# Patient Record
Sex: Male | Born: 1955 | Race: White | Hispanic: No | Marital: Married | Smoking: Never smoker
Health system: Southern US, Community
[De-identification: ages and names within clinical notes are randomized; demographics above are authoritative.]

## PROBLEM LIST (undated history)

## (undated) DIAGNOSIS — E038 Other specified hypothyroidism: Secondary | ICD-10-CM

## (undated) DIAGNOSIS — Z7901 Long term (current) use of anticoagulants: Secondary | ICD-10-CM

## (undated) DIAGNOSIS — G4731 Primary central sleep apnea: Secondary | ICD-10-CM

## (undated) DIAGNOSIS — Z86718 Personal history of other venous thrombosis and embolism: Secondary | ICD-10-CM

## (undated) DIAGNOSIS — E291 Testicular hypofunction: Secondary | ICD-10-CM

## (undated) DIAGNOSIS — E230 Hypopituitarism: Secondary | ICD-10-CM

## (undated) DIAGNOSIS — E7800 Pure hypercholesterolemia, unspecified: Secondary | ICD-10-CM

## (undated) DIAGNOSIS — E232 Diabetes insipidus: Secondary | ICD-10-CM

## (undated) DIAGNOSIS — E119 Type 2 diabetes mellitus without complications: Secondary | ICD-10-CM

## (undated) DIAGNOSIS — D352 Benign neoplasm of pituitary gland: Secondary | ICD-10-CM

## (undated) HISTORY — PX: TONSILLECTOMY: SUR1361

## (undated) HISTORY — PX: OTHER SURGICAL HISTORY: SHX169

## (undated) HISTORY — PX: CHOLECYSTECTOMY: SHX55

---

## 2001-06-18 HISTORY — PX: OTHER SURGICAL HISTORY: SHX169

## 2004-03-18 ENCOUNTER — Ambulatory Visit: Payer: Self-pay | Admitting: Internal Medicine

## 2004-04-12 ENCOUNTER — Emergency Department: Payer: Self-pay | Admitting: Emergency Medicine

## 2004-04-26 ENCOUNTER — Ambulatory Visit: Payer: Self-pay

## 2004-04-28 ENCOUNTER — Ambulatory Visit: Payer: Self-pay

## 2004-05-04 ENCOUNTER — Ambulatory Visit: Payer: Self-pay

## 2007-11-18 ENCOUNTER — Ambulatory Visit: Payer: Self-pay | Admitting: Radiology

## 2008-02-05 ENCOUNTER — Ambulatory Visit: Payer: Self-pay | Admitting: Internal Medicine

## 2011-05-22 ENCOUNTER — Ambulatory Visit: Payer: Self-pay | Admitting: Unknown Physician Specialty

## 2013-01-01 ENCOUNTER — Ambulatory Visit: Payer: Self-pay | Admitting: Internal Medicine

## 2013-06-18 DIAGNOSIS — I480 Paroxysmal atrial fibrillation: Secondary | ICD-10-CM

## 2013-06-18 HISTORY — DX: Paroxysmal atrial fibrillation: I48.0

## 2013-10-13 ENCOUNTER — Ambulatory Visit: Payer: Self-pay | Admitting: Cardiology

## 2013-11-12 ENCOUNTER — Ambulatory Visit: Payer: Self-pay | Admitting: Ophthalmology

## 2014-06-18 DIAGNOSIS — R3129 Other microscopic hematuria: Secondary | ICD-10-CM

## 2014-06-18 HISTORY — DX: Other microscopic hematuria: R31.29

## 2014-07-26 ENCOUNTER — Ambulatory Visit: Payer: Self-pay | Admitting: Internal Medicine

## 2014-09-29 ENCOUNTER — Ambulatory Visit
Admit: 2014-09-29 | Disposition: A | Discharge status: Home / Self Care | Payer: Self-pay | Attending: Internal Medicine | Admitting: Internal Medicine

## 2015-06-19 DIAGNOSIS — I724 Aneurysm of artery of lower extremity: Secondary | ICD-10-CM

## 2015-06-19 DIAGNOSIS — Z9889 Other specified postprocedural states: Secondary | ICD-10-CM

## 2015-06-19 DIAGNOSIS — H5347 Heteronymous bilateral field defects: Secondary | ICD-10-CM

## 2015-06-19 HISTORY — DX: Heteronymous bilateral field defects: H53.47

## 2015-06-19 HISTORY — DX: Other specified postprocedural states: Z98.890

## 2015-06-19 HISTORY — DX: Aneurysm of artery of lower extremity: I72.4

## 2015-11-04 IMAGING — CT CT ABD-PELV W/O CM
2 of 4 series · 17 of 46 positions shown, 19 images · non-contrast
Comparison: None.

CLINICAL DATA: Recurrent microscopic hematuria, asymptomatic. No
history of kidney stones. Status post cholecystectomy. History of
pituitary tumor status post resection.

EXAM:
CT ABDOMEN AND PELVIS WITHOUT CONTRAST
TECHNIQUE: Multidetector CT imaging of the abdomen and pelvis was performed
following the standard protocol without IV contrast.

[Series 2: stone · axial · 0.92mm/px · z∈[-919,-479]mm · 14 of 96 slices shown, 16 images]
[im 4/96  soft-tissue]
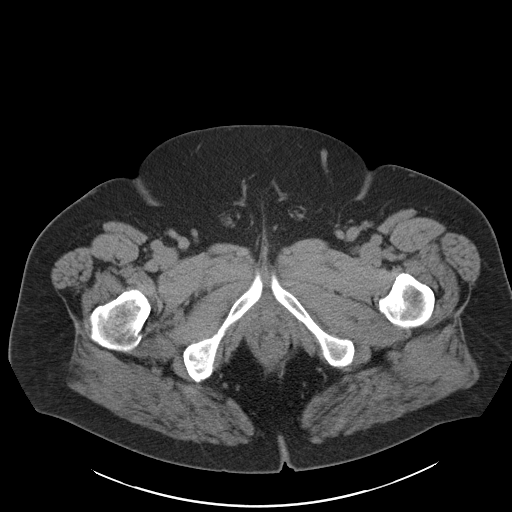
[im 4/96  bone]
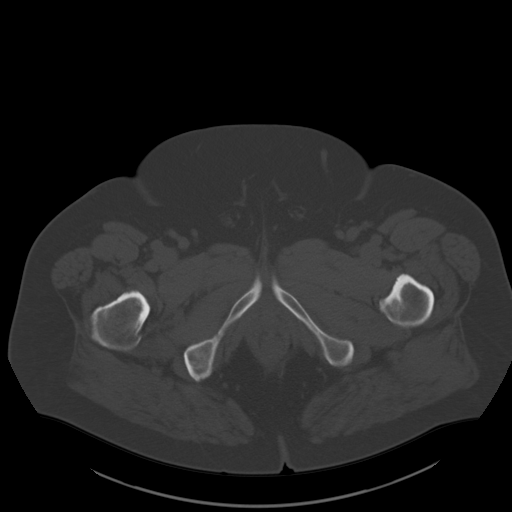
[im 12/96  soft-tissue]
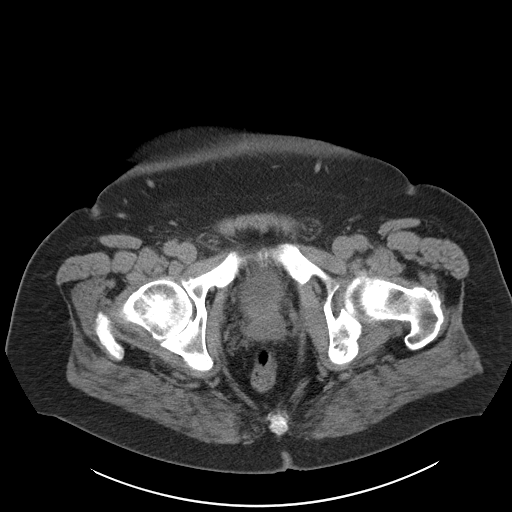
[im 20/96  soft-tissue]
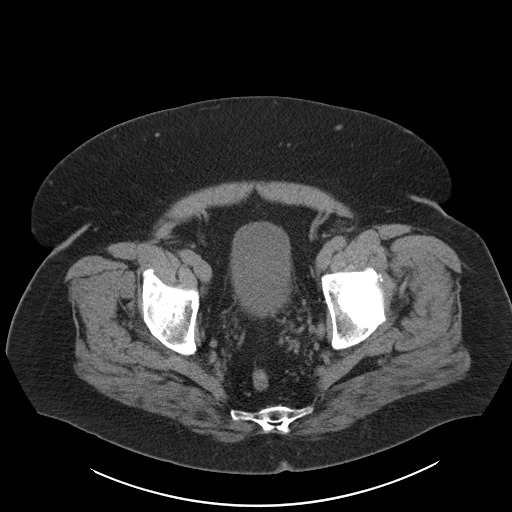
[im 27/96  soft-tissue]
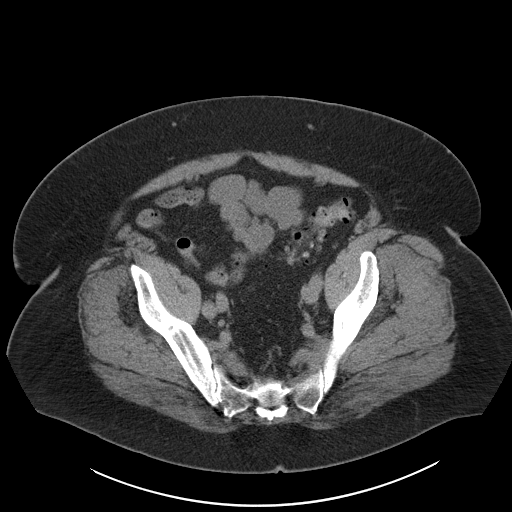
[im 31/96  soft-tissue]
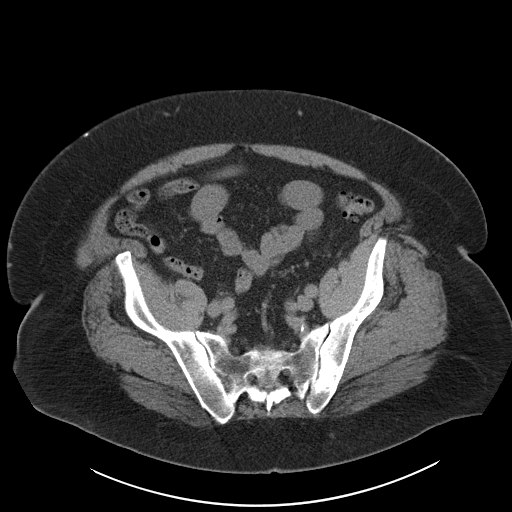
[im 39/96  soft-tissue]
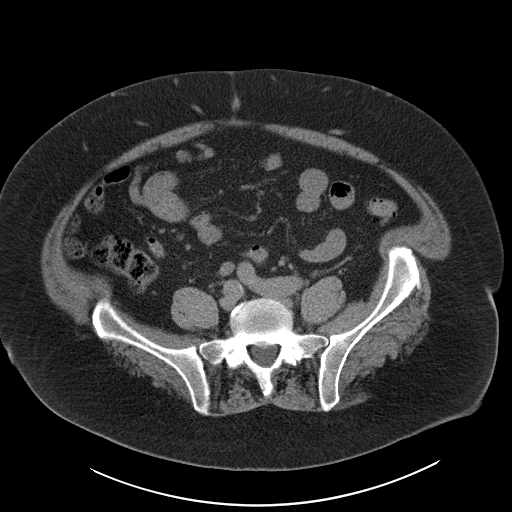
[im 46/96  soft-tissue]
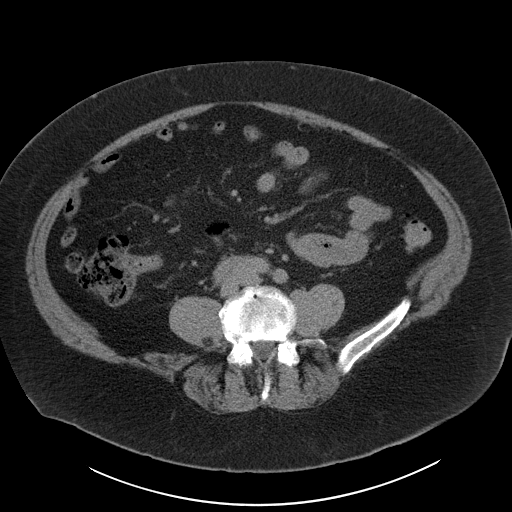
[im 50/96  soft-tissue]
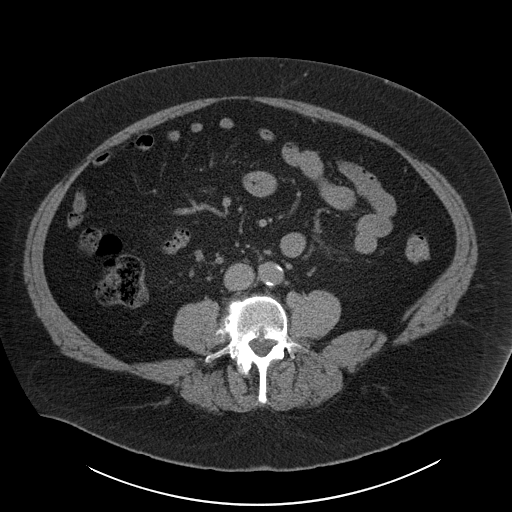
[im 58/96  soft-tissue]
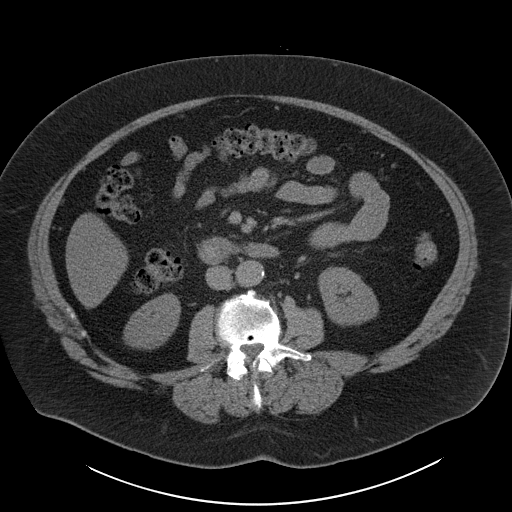
[im 58/96  bone]
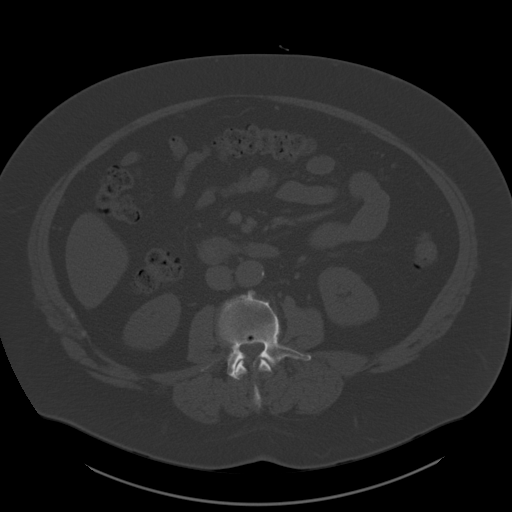
[im 65/96  soft-tissue]
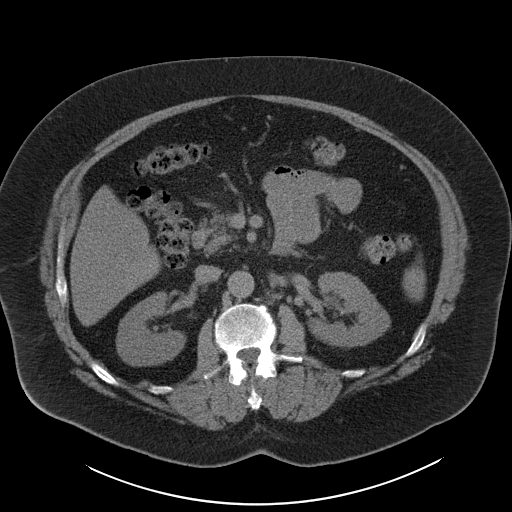
[im 73/96  soft-tissue]
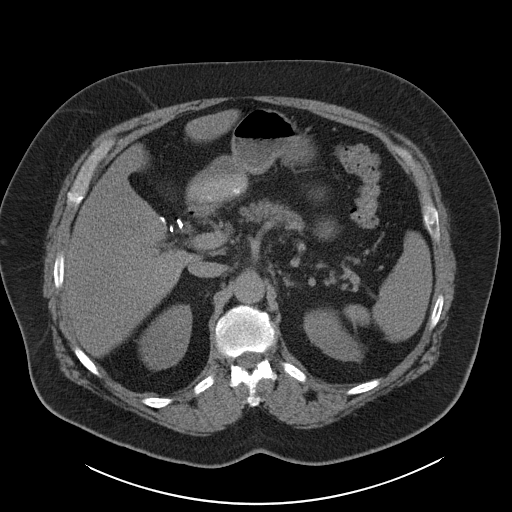
[im 77/96  soft-tissue]
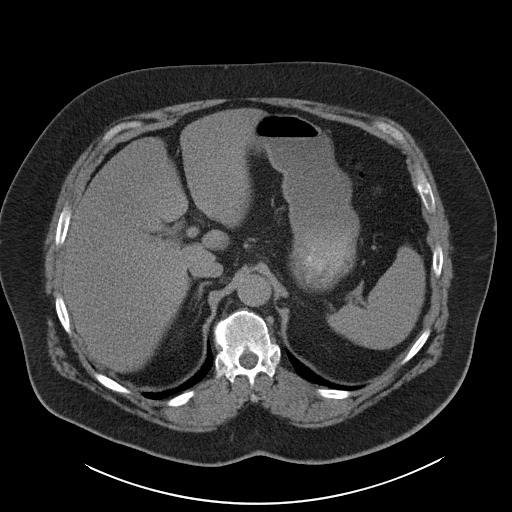
[im 84/96  soft-tissue]
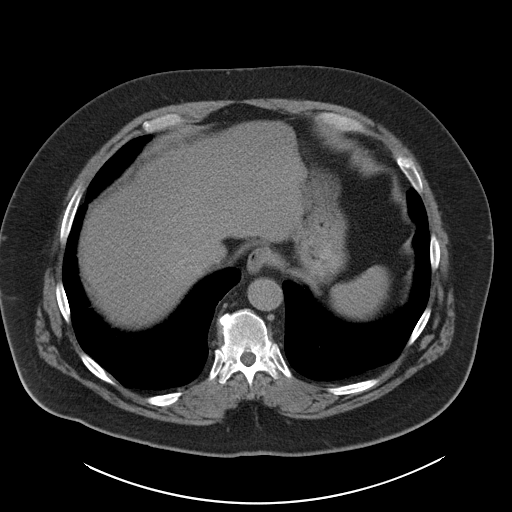
[im 92/96  soft-tissue]
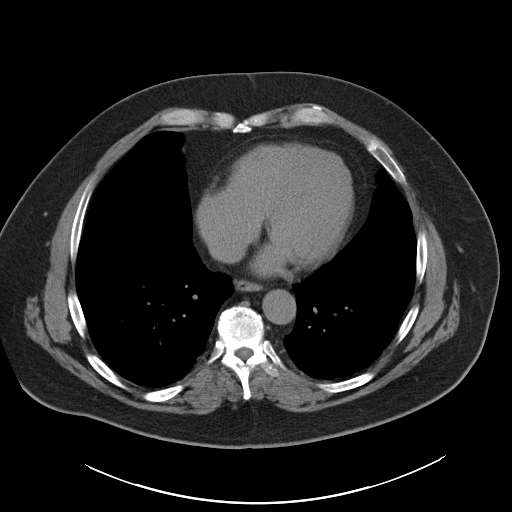

[Series 5: cor · coronal · 0.96mm/px · 3 of 186 slices shown]
[im 62/186  soft-tissue]
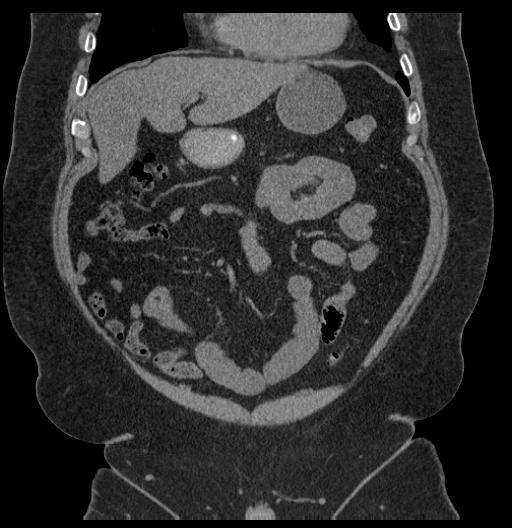
[im 83/186  soft-tissue]
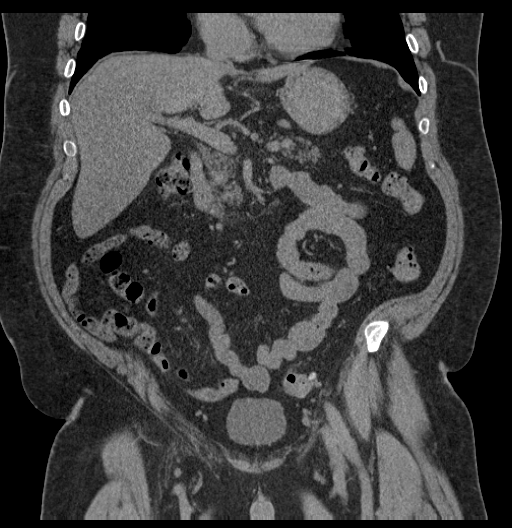
[im 103/186  soft-tissue]
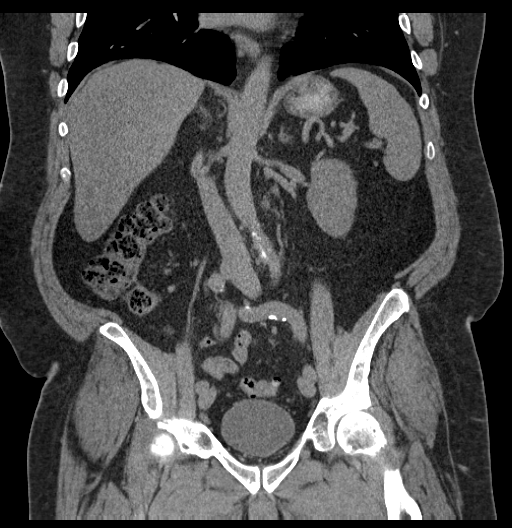

[17 of 46 positions shown; findings below may reference images not displayed]

FINDINGS: Lower chest:  Lung bases are clear.

Hepatobiliary: Mild to moderate hepatic steatosis.

Status post cholecystectomy. No intrahepatic or extrahepatic ductal
dilatation.

Pancreas: Within normal limits.

Spleen: Within normal limits.

Adrenals/Urinary Tract: Adrenal glands are within normal limits.

Kidneys are within normal limits. No renal calculi or
hydronephrosis.

No ureteral or bladder calculi.

Bladder is within normal limits.

Stomach/Bowel: Stomach is within normal limits.

No evidence of bowel obstruction.

Normal appendix.

Vascular/Lymphatic: Atherosclerotic calcifications of the abdominal
aorta and branch vessels.

No suspicious abdominopelvic lymphadenopathy.

Reproductive: Prostate is grossly unremarkable by CT.

Other: No abdominopelvic ascites.

Musculoskeletal: Degenerative changes of the visualized
thoracolumbar spine, most prominent at L2-3 and L3-4.
IMPRESSION: No renal, ureteral, or bladder calculi.  No hydronephrosis.

Mild to moderate hepatic steatosis.

Status post cholecystectomy.

## 2016-05-31 ENCOUNTER — Other Ambulatory Visit: Payer: Self-pay | Admitting: Internal Medicine

## 2016-05-31 DIAGNOSIS — D352 Benign neoplasm of pituitary gland: Secondary | ICD-10-CM

## 2022-12-05 ENCOUNTER — Encounter: Payer: Self-pay | Admitting: Internal Medicine

## 2022-12-11 ENCOUNTER — Encounter: Payer: Self-pay | Admitting: Internal Medicine

## 2022-12-12 ENCOUNTER — Ambulatory Visit
Admission: RE | Admit: 2022-12-12 | Discharge: 2022-12-12 | Disposition: A | Discharge status: Home / Self Care | Payer: 59 | Attending: Internal Medicine | Admitting: Internal Medicine

## 2022-12-12 ENCOUNTER — Encounter
Admission: RE | Disposition: A | Discharge status: Home / Self Care | Payer: Self-pay | Source: Home / Self Care | Attending: Internal Medicine

## 2022-12-12 ENCOUNTER — Ambulatory Visit: Payer: 59 | Admitting: Anesthesiology

## 2022-12-12 DIAGNOSIS — Z1211 Encounter for screening for malignant neoplasm of colon: Secondary | ICD-10-CM | POA: Diagnosis present

## 2022-12-12 DIAGNOSIS — E039 Hypothyroidism, unspecified: Secondary | ICD-10-CM | POA: Diagnosis not present

## 2022-12-12 DIAGNOSIS — Z9852 Vasectomy status: Secondary | ICD-10-CM | POA: Insufficient documentation

## 2022-12-12 DIAGNOSIS — I480 Paroxysmal atrial fibrillation: Secondary | ICD-10-CM | POA: Insufficient documentation

## 2022-12-12 DIAGNOSIS — E232 Diabetes insipidus: Secondary | ICD-10-CM | POA: Diagnosis not present

## 2022-12-12 DIAGNOSIS — E119 Type 2 diabetes mellitus without complications: Secondary | ICD-10-CM | POA: Diagnosis not present

## 2022-12-12 DIAGNOSIS — Z7901 Long term (current) use of anticoagulants: Secondary | ICD-10-CM | POA: Diagnosis not present

## 2022-12-12 DIAGNOSIS — K640 First degree hemorrhoids: Secondary | ICD-10-CM | POA: Insufficient documentation

## 2022-12-12 DIAGNOSIS — Z86018 Personal history of other benign neoplasm: Secondary | ICD-10-CM | POA: Diagnosis not present

## 2022-12-12 DIAGNOSIS — E7800 Pure hypercholesterolemia, unspecified: Secondary | ICD-10-CM | POA: Diagnosis not present

## 2022-12-12 DIAGNOSIS — Z86718 Personal history of other venous thrombosis and embolism: Secondary | ICD-10-CM | POA: Diagnosis not present

## 2022-12-12 DIAGNOSIS — Z9889 Other specified postprocedural states: Secondary | ICD-10-CM | POA: Diagnosis not present

## 2022-12-12 DIAGNOSIS — K5730 Diverticulosis of large intestine without perforation or abscess without bleeding: Secondary | ICD-10-CM | POA: Insufficient documentation

## 2022-12-12 DIAGNOSIS — G4731 Primary central sleep apnea: Secondary | ICD-10-CM | POA: Diagnosis not present

## 2022-12-12 DIAGNOSIS — Z9049 Acquired absence of other specified parts of digestive tract: Secondary | ICD-10-CM | POA: Insufficient documentation

## 2022-12-12 DIAGNOSIS — Z09 Encounter for follow-up examination after completed treatment for conditions other than malignant neoplasm: Secondary | ICD-10-CM | POA: Insufficient documentation

## 2022-12-12 DIAGNOSIS — D125 Benign neoplasm of sigmoid colon: Secondary | ICD-10-CM | POA: Diagnosis not present

## 2022-12-12 DIAGNOSIS — Z7985 Long-term (current) use of injectable non-insulin antidiabetic drugs: Secondary | ICD-10-CM | POA: Insufficient documentation

## 2022-12-12 HISTORY — DX: Testicular hypofunction: E29.1

## 2022-12-12 HISTORY — DX: Long term (current) use of anticoagulants: Z79.01

## 2022-12-12 HISTORY — DX: Dependence on other enabling machines and devices: G47.31

## 2022-12-12 HISTORY — PX: POLYPECTOMY: SHX5525

## 2022-12-12 HISTORY — DX: Benign neoplasm of pituitary gland: D35.2

## 2022-12-12 HISTORY — DX: Hypopituitarism: E23.0

## 2022-12-12 HISTORY — DX: Personal history of other venous thrombosis and embolism: Z86.718

## 2022-12-12 HISTORY — DX: Other specified hypothyroidism: E03.8

## 2022-12-12 HISTORY — PX: COLONOSCOPY: SHX5424

## 2022-12-12 HISTORY — DX: Type 2 diabetes mellitus without complications: E11.9

## 2022-12-12 HISTORY — DX: Diabetes insipidus: E23.2

## 2022-12-12 HISTORY — DX: Pure hypercholesterolemia, unspecified: E78.00

## 2022-12-12 LAB — GLUCOSE, CAPILLARY: Glucose-Capillary: 144 mg/dL — ABNORMAL HIGH (ref 70–99)

## 2022-12-12 SURGERY — COLONOSCOPY
Anesthesia: General

## 2022-12-12 MED ORDER — SODIUM CHLORIDE 0.9 % IV SOLN
INTRAVENOUS | Status: DC
  Administered 2022-12-12: 20 mL/h via INTRAVENOUS

## 2022-12-12 MED ORDER — PROPOFOL 500 MG/50ML IV EMUL
INTRAVENOUS | Status: DC | PRN
  Administered 2022-12-12: 120 ug/kg/min via INTRAVENOUS

## 2022-12-12 MED ORDER — LIDOCAINE HCL (CARDIAC) PF 100 MG/5ML IV SOSY
PREFILLED_SYRINGE | INTRAVENOUS | Status: DC | PRN
  Administered 2022-12-12: 70 mg via INTRAVENOUS
  Administered 2022-12-12: 30 mg via INTRAVENOUS

## 2022-12-12 MED ORDER — PROPOFOL 10 MG/ML IV BOLUS
INTRAVENOUS | Status: DC | PRN
  Administered 2022-12-12: 20 mg via INTRAVENOUS
  Administered 2022-12-12: 70 mg via INTRAVENOUS
  Administered 2022-12-12: 20 mg via INTRAVENOUS

## 2022-12-12 NOTE — Op Note (Signed)
[facility] Gastroenterology Patient Name: Alexander Cline Procedure Date: 12/12/2022 11:28 AM MRN: [id number] Account #: [id number] Date of Birth: Oct 29, 1955 Admit Type: Outpatient Age: 67 Room: [facility] ENDO ROOM 4 Gender: Male Note Status: Finalized Instrument Name: Colonoscope [id number] Procedure:             Colonoscopy Indications:           Screening for colorectal malignant neoplasm Providers:             Royce Macadamia K. Norma Fredrickson MD, MD Referring MD:          Daniel Nones, MD (Referring MD) Medicines:             Propofol per Anesthesia Complications:         No immediate complications. Procedure:             Pre-Anesthesia Assessment:                        - Prior to the procedure, a History and Physical was                         performed, and patient medications, allergies and                         sensitivities were reviewed. The patient's tolerance                         of previous anesthesia was reviewed.                        - The risks and benefits of the procedure and the                         sedation options and risks were discussed with the                         patient. All questions were answered and informed                         consent was obtained.                        - Patient identification and proposed procedure were                         verified prior to the procedure. The procedure was                         verified in the procedure room.                        - ASA Grade Assessment: III - A patient with severe                         systemic disease.                        - After reviewing the risks and benefits, the patient  was deemed in satisfactory condition to undergo the                         procedure.                        After obtaining informed consent, the colonoscope was                         passed under direct vision. Throughout the procedure,                         the  patient's blood pressure, pulse, and oxygen                         saturations were monitored continuously. The                         Colonoscope was introduced through the anus and                         advanced to the the cecum, identified by appendiceal                         orifice and ileocecal valve. The colonoscopy was                         performed without difficulty. The patient tolerated                         the procedure well. The quality of the bowel                         preparation was excellent. The ileocecal valve,                         appendiceal orifice, and rectum were photographed. Findings:      The perianal and digital rectal examinations were normal. Pertinent       negatives include normal sphincter tone and no palpable rectal lesions.      Non-bleeding internal hemorrhoids were found during retroflexion. The       hemorrhoids were Grade I (internal hemorrhoids that do not prolapse).      Many small-mouthed diverticula were found in the sigmoid colon. There       was no evidence of diverticular bleeding.      A 5 mm polyp was found in the sigmoid colon. The polyp was sessile. The       polyp was removed with a jumbo cold forceps. Resection and retrieval       were complete.      The exam was otherwise without abnormality. Impression:            - Non-bleeding internal hemorrhoids.                        - Mild diverticulosis in the sigmoid colon. There was                         no evidence of diverticular bleeding.                        -  One 5 mm polyp in the sigmoid colon, removed with a                         jumbo cold forceps. Resected and retrieved.                        - The examination was otherwise normal. Recommendation:        - Patient has a contact number available for                         emergencies. The signs and symptoms of potential                         delayed complications were discussed with the patient.                          Return to normal activities tomorrow. Written                         discharge instructions were provided to the patient.                        - Resume previous diet.                        - Continue present medications.                        - Repeat colonoscopy is recommended for surveillance.                         The colonoscopy date will be determined after                         pathology results from today's exam become available                         for review.                        - Return to GI office PRN.                        - The findings and recommendations were discussed with                         the patient. Procedure Code(s):     --- Professional ---                        [phone], Colonoscopy, flexible; with biopsy, single or                         multiple Diagnosis Code(s):     --- Professional ---                        K57.30, Diverticulosis of large intestine without                         perforation or abscess without bleeding  D12.5, Benign neoplasm of sigmoid colon                        K64.0, First degree hemorrhoids                        Z12.11, Encounter for screening for malignant neoplasm                         of colon CPT copyright 2022 [employer]. All rights reserved. The codes documented in this report are preliminary and upon coder review may  be revised to meet current compliance requirements. Stanton Kidney MD, MD 12/12/2022 11:55:29 AM This report has been signed electronically. Number of Addenda: 0 Note Initiated On: 12/12/2022 11:28 AM Scope Withdrawal Time: 0 hours 5 minutes 19 seconds  Total Procedure Duration: 0 hours 7 minutes 42 seconds  Estimated Blood Loss:  Estimated blood loss: none.      [facility]

## 2022-12-12 NOTE — H&P (Signed)
Outpatient short stay form Pre-procedure 12/12/2022 11:25 AM Dara Beidleman K. Norma Fredrickson, M.D.  Primary Physician: Daniel Nones III, M.D.  Reason for visit:  Colon cancer screening  History of present illness:  Mr. Kirk reports doing well from a GI standpoint. He denies any issues with constipation, diarrhea, melena or rectal bleeding. Moving his bowels daily. Feels bowels slowed down slightly when starting Trulicity. No lower abd pain.   He denies any GERD, nausea, vomiting, epigastric pain, or dysphagia. He denies alcohol tobacco or NSAIDs. Uses Tylenol for pain.  Denies any family history of colon polyps or colon cance     Current Facility-Administered Medications:    0.9 %  sodium chloride infusion, , Intravenous, Continuous, Jakarius Flamenco, Boykin Nearing, MD, Last Rate: 20 mL/hr at 12/12/22 1013, 20 mL/hr at 12/12/22 1013  Medications Prior to Admission  Medication Sig Dispense Refill Last Dose   co-enzyme Q-10 30 MG capsule Take 30 mg by mouth 3 (three) times daily.   Past Week   Coenzyme Q10-Vitamin E 100-150 MG-UNIT CAPS Take 1 capsule by mouth daily.   Past Week   desmopressin (DDAVP NASAL) 0.01 % solution Place 10 mcg into the nose 2 (two) times daily.   12/11/2022   diltiazem (CARDIZEM CD) 240 MG 24 hr capsule Take 120 mg by mouth daily.   12/12/2022 at 0600   Dulaglutide 0.75 MG/0.5ML SOPN Inject 0.5 mLs into the skin 2 (two) times daily.   Past Month   flecainide (TAMBOCOR) 100 MG tablet Take 100 mg by mouth 2 (two) times daily.   12/12/2022 at 0600   levothyroxine (SYNTHROID) 100 MCG tablet Take 100 mcg by mouth daily before breakfast.   12/12/2022 at 0600   losartan (COZAAR) 25 MG tablet Take 25 mg by mouth daily.   12/12/2022 at 0600   magnesium oxide (MAG-OX) 400 (240 Mg) MG tablet Take 400 mg by mouth daily.   Past Week   Multiple Vitamin (MULTIVITAMIN) tablet Take 1 tablet by mouth daily.   Past Week   Multiple Vitamins-Minerals (PRESERVISION AREDS PO) Take 1 Dose by mouth daily.   Past Week    predniSONE (DELTASONE) 5 MG tablet Take 5 mg by mouth daily with breakfast. TAKES 5 MG TAB IN THE MORNING AND 2.5 MG TAB IN THE EVENING   12/12/2022 at 0600   simvastatin (ZOCOR) 20 MG tablet Take 20 mg by mouth daily.   12/11/2022   testosterone (ANDROGEL) 50 MG/5GM (1%) GEL Place 2.025 g onto the skin daily.   Past Week   triamcinolone cream (KENALOG) 0.1 % Apply 1 Application topically 2 (two) times daily.   Past Week   warfarin (COUMADIN) 5 MG tablet Take 5 mg by mouth daily. 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    Physical Exam  Gen: Alert, oriented. Appears stated age.  HEENT: [place]/AT. PERRLA. Lungs: CTA, no wheezes. CV: RR nl S1, S2. Abd: soft, benign, no masses. BS+ Ext: No edema. Pulses 2+    Planned procedures: Proceed with colonoscopy. The patient understands the nature of the planned procedure, indications, risks, alternatives and potential complications including but not limited to bleeding, infection, perforation, damage to internal organs and possible  oversedation/side effects from anesthesia. The patient agrees and gives consent to proceed.  Please refer to procedure notes for findings, recommendations and patient disposition/instructions.     Megan Hayduk K. Norma Fredrickson, M.D. Gastroenterology 12/12/2022  11:25 AM

## 2022-12-12 NOTE — Transfer of Care (Signed)
Immediate Anesthesia Transfer of Care Note  Patient: Alexander Cline  Procedure(s) Performed: COLONOSCOPY  Patient Location: PACU  Anesthesia Type:General  Level of Consciousness: awake, alert , and oriented  Airway & Oxygen Therapy: Patient Spontanous Breathing  Post-op Assessment: Report given to RN and Post -op Vital signs reviewed and stable  Post vital signs: Reviewed and stable  Last Vitals:  Vitals Value Taken Time  BP 110/73 12/12/22 1157  Temp 36.4 C 12/12/22 1157  Pulse 59 12/12/22 1203  Resp 18 12/12/22 1203  SpO2 92 % 12/12/22 1203  Vitals shown include unvalidated device data.  Last Pain:  Vitals:   12/12/22 1158  TempSrc:   PainSc: 0-No pain         Complications: No notable events documented.

## 2022-12-12 NOTE — Anesthesia Preprocedure Evaluation (Signed)
Anesthesia Evaluation  Patient identified by MRN, date of birth, ID band Patient awake    Reviewed: Allergy & Precautions, H&P , NPO status , Patient's Chart, lab work & pertinent test results  Airway Mallampati: II  TM Distance: >3 FB Neck ROM: full    Dental no notable dental hx.    Pulmonary sleep apnea    Pulmonary exam normal        Cardiovascular Normal cardiovascular exam+ dysrhythmias Atrial Fibrillation      Neuro/Psych negative neurological ROS  negative psych ROS   GI/Hepatic negative GI ROS, Neg liver ROS,,,  Endo/Other  diabetes, Type 2Hypothyroidism  Diabetes insipidus post pituitary removal  Renal/GU negative Renal ROS  negative genitourinary   Musculoskeletal   Abdominal  (+) + obese  Peds  Hematology negative hematology ROS (+)   Anesthesia Other Findings Panhypopituitarism  Past Medical History: No date: Anticoagulated on warfarin 2017: Bilateral hemianopia No date: Dependence on bilevel positive airway pressure (BiPAP)  ventilation due to central sleep apnea No date: Diabetes insipidus (HCC) 2016: Hematuria, microscopic No date: History of DVT (deep vein thrombosis) No date: Hypercholesterolemia No date: Hypogonadism male No date: Panhypopituitarism (HCC) 2015: Paroxysmal atrial fibrillation (HCC) No date: Pituitary adenoma (HCC) 2017: Pseudoaneurysm of right femoral artery (HCC) No date: Secondary hypothyroidism 2017: Status post ablation of atrial fibrillation No date: Type 2 diabetes mellitus without complication, without long- term current use of insulin (HCC)  Past Surgical History: No date: CHOLECYSTECTOMY 2003: Status post trans-sphenoidal resection No date: Status post vasectomy No date: TONSILLECTOMY  BMI    Body Mass Index: 37.13 kg/m      Reproductive/Obstetrics negative OB ROS                             Anesthesia Physical Anesthesia  Plan  ASA: 3  Anesthesia Plan: General   Post-op Pain Management:    Induction: Intravenous  PONV Risk Score and Plan: Propofol infusion and TIVA  Airway Management Planned: Natural Airway  Additional Equipment:   Intra-op Plan:   Post-operative Plan:   Informed Consent: I have reviewed the patients History and Physical, chart, labs and discussed the procedure including the risks, benefits and alternatives for the proposed anesthesia with the patient or authorized representative who has indicated his/her understanding and acceptance.     Dental Advisory Given  Plan Discussed with: CRNA and Surgeon  Anesthesia Plan Comments:         Anesthesia Quick Evaluation

## 2022-12-12 NOTE — Interval H&P Note (Signed)
History and Physical Interval Note:  12/12/2022 11:26 AM  Alexander Cline  has presented today for surgery, with the diagnosis of V76.51 (ICD-9-CM) - Z12.11 (ICD-10-CM) - Colon cancer screening V58.61 (ICD-9-CM) - Z79.01 (ICD-10-CM) - Anticoagulated.  The various methods of treatment have been discussed with the patient and family. After consideration of risks, benefits and other options for treatment, the patient has consented to  Procedure(s): COLONOSCOPY (N/A) as a surgical intervention.  The patient's history has been reviewed, patient examined, no change in status, stable for surgery.  I have reviewed the patient's chart and labs.  Questions were answered to the patient's satisfaction.     [place], [place]

## 2022-12-13 ENCOUNTER — Encounter: Payer: Self-pay | Admitting: Internal Medicine

## 2022-12-13 NOTE — Anesthesia Postprocedure Evaluation (Signed)
Anesthesia Post Note  Patient: Alexander Cline  Procedure(s) Performed: COLONOSCOPY POLYPECTOMY  Patient location during evaluation: Endoscopy Anesthesia Type: General Level of consciousness: awake and alert Pain management: pain level controlled Vital Signs Assessment: post-procedure vital signs reviewed and stable Respiratory status: spontaneous breathing, nonlabored ventilation and respiratory function stable Cardiovascular status: blood pressure returned to baseline and stable Postop Assessment: no apparent nausea or vomiting Anesthetic complications: no   No notable events documented.   Last Vitals:  Vitals:   12/12/22 1157 12/12/22 1217  BP: 110/73 (!) 139/103  Pulse: 61   Resp: 10   Temp: (!) 36.4 C   SpO2: 94%     Last Pain:  Vitals:   12/12/22 1217  TempSrc:   PainSc: 0-No pain                 Foye Deer
